# Patient Record
Sex: Female | Born: 1995 | Race: Black or African American | Hispanic: No | Marital: Single | Smoking: Never smoker
Health system: Southern US, Community
[De-identification: ages and names within clinical notes are randomized; demographics above are authoritative.]

## PROBLEM LIST (undated history)

## (undated) DIAGNOSIS — E282 Polycystic ovarian syndrome: Secondary | ICD-10-CM

## (undated) DIAGNOSIS — J189 Pneumonia, unspecified organism: Secondary | ICD-10-CM

## (undated) DIAGNOSIS — J45909 Unspecified asthma, uncomplicated: Secondary | ICD-10-CM

## (undated) HISTORY — PX: WISDOM TOOTH EXTRACTION: SHX21

---

## 2013-01-14 HISTORY — PX: DILATION AND CURETTAGE OF UTERUS: SHX78

## 2014-12-03 ENCOUNTER — Encounter (HOSPITAL_COMMUNITY): Payer: Self-pay | Admitting: *Deleted

## 2014-12-03 ENCOUNTER — Emergency Department (HOSPITAL_COMMUNITY)
Admission: EM | Admit: 2014-12-03 | Discharge: 2014-12-03 | Disposition: A | Discharge status: Home / Self Care | Payer: Self-pay | Attending: Emergency Medicine | Admitting: Emergency Medicine

## 2014-12-03 DIAGNOSIS — E860 Dehydration: Secondary | ICD-10-CM | POA: Insufficient documentation

## 2014-12-03 DIAGNOSIS — Z3202 Encounter for pregnancy test, result negative: Secondary | ICD-10-CM | POA: Insufficient documentation

## 2014-12-03 DIAGNOSIS — R1084 Generalized abdominal pain: Secondary | ICD-10-CM | POA: Insufficient documentation

## 2014-12-03 LAB — CBC WITH DIFFERENTIAL/PLATELET
BASOS ABS: 0 10*3/uL (ref 0.0–0.1)
BASOS PCT: 0 %
Eosinophils Absolute: 0.1 10*3/uL (ref 0.0–0.7)
Eosinophils Relative: 1 %
HEMATOCRIT: 42.3 % (ref 36.0–46.0)
HEMOGLOBIN: 14.5 g/dL (ref 12.0–15.0)
LYMPHS PCT: 25 %
Lymphs Abs: 1.8 10*3/uL (ref 0.7–4.0)
MCH: 31.1 pg (ref 26.0–34.0)
MCHC: 34.3 g/dL (ref 30.0–36.0)
MCV: 90.8 fL (ref 78.0–100.0)
MONO ABS: 0.5 10*3/uL (ref 0.1–1.0)
Monocytes Relative: 7 %
NEUTROS ABS: 4.6 10*3/uL (ref 1.7–7.7)
NEUTROS PCT: 67 %
Platelets: 288 10*3/uL (ref 150–400)
RBC: 4.66 MIL/uL (ref 3.87–5.11)
RDW: 11.6 % (ref 11.5–15.5)
WBC: 6.9 10*3/uL (ref 4.0–10.5)

## 2014-12-03 LAB — COMPREHENSIVE METABOLIC PANEL
ALT: 17 U/L (ref 14–54)
ANION GAP: 7 (ref 5–15)
AST: 26 U/L (ref 15–41)
Albumin: 3.8 g/dL (ref 3.5–5.0)
Alkaline Phosphatase: 58 U/L (ref 38–126)
BILIRUBIN TOTAL: 0.6 mg/dL (ref 0.3–1.2)
BUN: 9 mg/dL (ref 6–20)
CALCIUM: 9.4 mg/dL (ref 8.9–10.3)
CO2: 24 mmol/L (ref 22–32)
Chloride: 105 mmol/L (ref 101–111)
Creatinine, Ser: 1.09 mg/dL — ABNORMAL HIGH (ref 0.44–1.00)
GLUCOSE: 116 mg/dL — AB (ref 65–99)
POTASSIUM: 3.9 mmol/L (ref 3.5–5.1)
Sodium: 136 mmol/L (ref 135–145)
TOTAL PROTEIN: 7.5 g/dL (ref 6.5–8.1)

## 2014-12-03 LAB — I-STAT BETA HCG BLOOD, ED (MC, WL, AP ONLY)

## 2014-12-03 LAB — URINE MICROSCOPIC-ADD ON

## 2014-12-03 LAB — LIPASE, BLOOD: Lipase: 27 U/L (ref 11–51)

## 2014-12-03 LAB — URINALYSIS, ROUTINE W REFLEX MICROSCOPIC
BILIRUBIN URINE: NEGATIVE
Glucose, UA: NEGATIVE mg/dL
Hgb urine dipstick: NEGATIVE
Ketones, ur: NEGATIVE mg/dL
NITRITE: NEGATIVE
PH: 6 (ref 5.0–8.0)
Protein, ur: NEGATIVE mg/dL
SPECIFIC GRAVITY, URINE: 1.034 — AB (ref 1.005–1.030)

## 2014-12-03 MED ORDER — DEXAMETHASONE SODIUM PHOSPHATE 4 MG/ML IJ SOLN
8.0000 mg | Freq: Once | INTRAMUSCULAR | Status: AC
  Administered 2014-12-03: 8 mg via INTRAVENOUS
  Filled 2014-12-03: qty 2

## 2014-12-03 MED ORDER — DIPHENHYDRAMINE HCL 50 MG/ML IJ SOLN
25.0000 mg | Freq: Once | INTRAMUSCULAR | Status: AC
  Administered 2014-12-03: 25 mg via INTRAVENOUS
  Filled 2014-12-03: qty 1

## 2014-12-03 MED ORDER — SODIUM CHLORIDE 0.9 % IV BOLUS (SEPSIS)
1000.0000 mL | Freq: Once | INTRAVENOUS | Status: AC
  Administered 2014-12-03: 1000 mL via INTRAVENOUS

## 2014-12-03 MED ORDER — MORPHINE SULFATE (PF) 4 MG/ML IV SOLN
4.0000 mg | Freq: Once | INTRAVENOUS | Status: AC
  Administered 2014-12-03: 4 mg via INTRAVENOUS
  Filled 2014-12-03: qty 1

## 2014-12-03 MED ORDER — ONDANSETRON HCL 4 MG/2ML IJ SOLN
4.0000 mg | Freq: Once | INTRAMUSCULAR | Status: AC
  Administered 2014-12-03: 4 mg via INTRAVENOUS
  Filled 2014-12-03: qty 2

## 2014-12-03 MED ORDER — PROMETHAZINE HCL 25 MG PO TABS: 25.0000 mg | ORAL_TABLET | Freq: Four times a day (QID) | ORAL | Status: DC | PRN

## 2014-12-03 NOTE — ED Provider Notes (Signed)
CSN: [id number]     Arrival date & time 12/03/14  1358 History   First MD Initiated Contact with Patient 12/03/14 1415     Chief Complaint  Patient presents with  . Emesis  . Abdominal Pain   HPI   Michele Peters is a 19 y.o. F with no significant PMH presenting with generalized abdominal pain, N/V, and a headache since yesterday. She describes her abdominal pain as 7/10 pain scale, crampy, generalized, non-radiating, constant. She describes her headache as temporal, 7/10 pain scale, intermittent, dull, lasting a few minutes, non-radiating. She does not have a headache at this time. She endorses nausea and vomiting (one episode). She denies fevers, chills, AMS, visual changes, neck stiffness, CP, SOB, diarrhea, dysuria, vaginal odor/itching/discharge, alleviation attempts, recent travel/abx use, raw/undercooked foods, ill contacts.   History reviewed. No pertinent past medical history. History reviewed. No pertinent past surgical history. History reviewed. No pertinent family history. Social History  Substance Use Topics  . Smoking status: Never Smoker   . Smokeless tobacco: None  . Alcohol Use: No   OB History    No data available     Review of Systems  Ten systems are reviewed and are negative for acute change except as noted in the HPI  Allergies  Eggs or egg-derived products  Home Medications   Prior to Admission medications   Medication Sig Start Date End Date Taking? Authorizing Provider  diphenhydrAMINE (BENADRYL) 25 MG tablet Take 25 mg by mouth every 6 (six) hours as needed for itching.   Yes Historical Provider, MD  ibuprofen (ADVIL,MOTRIN) 200 MG tablet Take 200 mg by mouth every 6 (six) hours as needed for fever.   Yes Historical Provider, MD   BP 146/101 mmHg  Pulse 111  Temp(Src) 98.9 F (37.2 C) (Oral)  Resp 18  SpO2 99%  LMP 11/08/2014 Physical Exam  Constitutional: She appears well-developed and well-nourished. No distress.  HENT:  Head:  Normocephalic and atraumatic.  Right Ear: External ear normal.  Left Ear: External ear normal.  Nose: Nose normal.  Mouth/Throat: Oropharynx is clear and moist. No oropharyngeal exudate.  Eyes: Conjunctivae are normal. Pupils are equal, round, and reactive to light. Right eye exhibits no discharge. Left eye exhibits no discharge. No scleral icterus.  Neck: No tracheal deviation present.  Cardiovascular: Normal rate, regular rhythm, normal heart sounds and intact distal pulses.  Exam reveals no gallop and no friction rub.   No murmur heard. Pulmonary/Chest: Effort normal and breath sounds normal. No respiratory distress. She has no wheezes. She has no rales. She exhibits no tenderness.  Abdominal: Soft. Bowel sounds are normal. She exhibits no distension and no mass. There is tenderness. There is no rebound and no guarding.  Generalized abdominal tenderness.  Musculoskeletal: She exhibits no edema.  Lymphadenopathy:    She has no cervical adenopathy.  Neurological: She is alert. Coordination normal.  Skin: Skin is warm and dry. No rash noted. She is not diaphoretic. No erythema.  Psychiatric: She has a normal mood and affect. Her behavior is normal.  Nursing note and vitals reviewed.   ED Course  Procedures (including critical care time) Labs Review Labs Reviewed  COMPREHENSIVE METABOLIC PANEL - Abnormal; Notable for the following:    Glucose, Bld 116 (*)    Creatinine, Ser 1.09 (*)    All other components within normal limits  URINALYSIS, ROUTINE W REFLEX MICROSCOPIC (NOT AT [facility]) - Abnormal; Notable for the following:    Specific Gravity, Urine 1.034 (*)  Leukocytes, UA SMALL (*)    All other components within normal limits  URINE MICROSCOPIC-ADD ON - Abnormal; Notable for the following:    Squamous Epithelial / LPF 0-5 (*)    Bacteria, UA FEW (*)    All other components within normal limits  CBC WITH DIFFERENTIAL/PLATELET  LIPASE, BLOOD  I-STAT BETA HCG BLOOD, ED ([facility], [facility],  [facility] ONLY)     MDM   Final diagnoses:  Dehydration   Patient non-toxic appearing and VSS. Afebrile. Diffuse abdominal pain on exam, without focal region of tenderness. No CVA tenderness. Most likely is gastroenteritis, IBS/IBD. Appendicitis, mesenteric ischemia, peritonitis, AAA, SBO, large bowel obstruction/volvulus, SBP, DKA less likely based on patient history and physical exam.   3:01 PM Informed patient is having allergic rxn to morphine and zofran. Hives on right arm.   Labs demonstrate dehydration. Possible UTI but patient's symptoms/exam do not reflect this, so will not treat at this time.   Reevaluated patient after decadron shot. Hives have dissipated.  Patient feeling better after fluids. Can tolerate PO. Patient may be safely discharged home. Discussed reasons for return. Patient to follow-up with primary care provider within one week. Patient in understanding and agreement with the plan.    Melton KrebsSamantha Nicole Parissa Chiao, PA-C 12/16/14 2103  Nelva Nayobert Beaton, MD 12/19/14 2236

## 2014-12-03 NOTE — ED Notes (Signed)
Pt is in stable condition upon d/c and ambulates from ED. 

## 2014-12-03 NOTE — Discharge Instructions (Signed)
Ms. Michele Peters,  Nice meeting you! Please continue fluid intake and start bland foods as tolerated. Return to the emergency department if you develop fevers, chills, increasing abdominal pain, or are unable to keep fluids down. Feel better soon!  S. Lane HackerNicole Hernandez [place]asso, PA-C

## 2014-12-03 NOTE — ED Notes (Signed)
Pt reports abd cramping, n/v, headache since yesterday. Denies diarrhea.

## 2014-12-18 ENCOUNTER — Emergency Department (HOSPITAL_COMMUNITY)
Admission: EM | Admit: 2014-12-18 | Discharge: 2014-12-18 | Disposition: A | Discharge status: Home / Self Care | Payer: Self-pay | Attending: Emergency Medicine | Admitting: Emergency Medicine

## 2014-12-18 ENCOUNTER — Emergency Department (HOSPITAL_COMMUNITY): Payer: Self-pay

## 2014-12-18 ENCOUNTER — Encounter (HOSPITAL_COMMUNITY): Payer: Self-pay | Admitting: *Deleted

## 2014-12-18 DIAGNOSIS — Y999 Unspecified external cause status: Secondary | ICD-10-CM | POA: Insufficient documentation

## 2014-12-18 DIAGNOSIS — Z79899 Other long term (current) drug therapy: Secondary | ICD-10-CM | POA: Insufficient documentation

## 2014-12-18 DIAGNOSIS — R112 Nausea with vomiting, unspecified: Secondary | ICD-10-CM | POA: Insufficient documentation

## 2014-12-18 DIAGNOSIS — Y9289 Other specified places as the place of occurrence of the external cause: Secondary | ICD-10-CM | POA: Insufficient documentation

## 2014-12-18 DIAGNOSIS — Y9389 Activity, other specified: Secondary | ICD-10-CM | POA: Insufficient documentation

## 2014-12-18 DIAGNOSIS — S022XXA Fracture of nasal bones, initial encounter for closed fracture: Secondary | ICD-10-CM | POA: Insufficient documentation

## 2014-12-18 MED ORDER — NAPROXEN 500 MG PO TABS: 500.0000 mg | ORAL_TABLET | Freq: Two times a day (BID) | ORAL | Status: DC

## 2014-12-18 MED ORDER — HYDROCODONE-ACETAMINOPHEN 5-325 MG PO TABS
2.0000 | ORAL_TABLET | Freq: Once | ORAL | Status: AC
Start: 2014-12-18 — End: 2014-12-18
  Administered 2014-12-18: 2 via ORAL
  Filled 2014-12-18: qty 2

## 2014-12-18 NOTE — ED Notes (Signed)
Pt called out and stated "I would like to have some pain medicine" Antony MaduraKelly Humes PA notified and gave verbal order to give pt 2 norco.
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
## 2014-12-18 NOTE — ED Notes (Signed)
Pt verbalized understanding of d/c instructions and has no further questions. Pt stable and nad.  

## 2014-12-18 NOTE — Discharge Instructions (Signed)
Nasal Fracture A nasal fracture is a break or crack in the bones or cartilage of the nose. Minor breaks do not require treatment. These breaks usually heal on their own after about one month. Serious breaks may require surgery. CAUSES This injury is usually caused by a blunt injury to the nose. This type of injury often occurs from:  Contact sports.  Car accidents.  Falls.  Getting punched. SYMPTOMS Symptoms of this injury include:  Pain.  Swelling of the nose.  Bleeding from the nose.  Bruising around the nose or eyes. This may include having black eyes.  Crooked appearance of the nose. DIAGNOSIS This injury may be diagnosed with a physical exam. The health care provider will gently feel the nose for signs of broken bones. He or she will look inside the nostrils to make sure that there is not a blood-filled swelling on the dividing wall between the nostrils (septal hematoma). X-rays of the nose may not show a nasal fracture even when one is present. In some cases, X-rays or a CT scan may be done 1-5 days after the injury. Sometimes, the health care provider will want to wait until the swelling has gone down. TREATMENT Often, minor fractures that have caused no deformity do not require treatment. More serious fractures in which bones have moved out of position may require surgery, which will take place after the swelling is gone. Surgery will stabilize and align the fracture. In some cases, a health care provider may be able to reposition the bones without surgery. This may be done in the health care provider's office after medicine is given to numb the area (local anesthetic). HOME CARE INSTRUCTIONS  If directed, apply ice to the injured area:  Put ice in a plastic bag.  Place a towel between your skin and the bag.  Leave the ice on for 20 minutes, 2-3 times per day.  Take over-the-counter and prescription medicines only as told by your health care provider.  If your nose  starts to bleed, sit in an upright position while you squeeze the soft parts of your nose against the dividing wall between your nostrils (septum) for 10 minutes.  Try to avoid blowing your nose.  Return to your normal activities as told by your health care provider. Ask your health care provider what activities are safe for you.  Avoid contact sports for 3-4 weeks or as told by your health care provider.  Keep all follow-up visits as told by your health care provider. This is important. SEEK MEDICAL CARE IF:  Your pain increases or becomes severe.  You continue to have nosebleeds.  The shape of your nose does not return to normal within 5 days.  You have pus draining out of your nose. SEEK IMMEDIATE MEDICAL CARE IF:  You have bleeding from your nose that does not stop after you pinch your nostrils closed for 20 minutes and keep ice on your nose.  You have clear fluid draining out of your nose.  You notice a grape-like swelling on the septum. This swelling is a collection of blood (hematoma) that must be drained to help prevent infection.  You have difficulty moving your eyes.  You have repeated vomiting.   This information is not intended to replace advice given to you by your health care provider. Make sure you discuss any questions you have with your health care provider.   Document Released: 12/29/1999 Document Revised: 09/21/2014 Document Reviewed: 02/07/2014 Elsevier Interactive Patient Education 2016 [employer]  
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
## 2015-01-30 ENCOUNTER — Encounter (HOSPITAL_COMMUNITY): Payer: Self-pay

## 2015-01-30 DIAGNOSIS — Z3202 Encounter for pregnancy test, result negative: Secondary | ICD-10-CM | POA: Diagnosis not present

## 2015-01-30 DIAGNOSIS — N926 Irregular menstruation, unspecified: Secondary | ICD-10-CM | POA: Diagnosis not present

## 2015-01-30 DIAGNOSIS — R1030 Lower abdominal pain, unspecified: Secondary | ICD-10-CM | POA: Diagnosis present

## 2015-01-30 LAB — URINALYSIS, ROUTINE W REFLEX MICROSCOPIC
Bilirubin Urine: NEGATIVE
Glucose, UA: NEGATIVE mg/dL
KETONES UR: NEGATIVE mg/dL
NITRITE: NEGATIVE
PH: 5 (ref 5.0–8.0)
Protein, ur: NEGATIVE mg/dL
SPECIFIC GRAVITY, URINE: 1.024 (ref 1.005–1.030)

## 2015-01-30 LAB — URINE MICROSCOPIC-ADD ON

## 2015-01-30 LAB — CBC
HEMATOCRIT: 43.3 % (ref 36.0–46.0)
HEMOGLOBIN: 14.7 g/dL (ref 12.0–15.0)
MCH: 31.1 pg (ref 26.0–34.0)
MCHC: 33.9 g/dL (ref 30.0–36.0)
MCV: 91.5 fL (ref 78.0–100.0)
Platelets: 341 10*3/uL (ref 150–400)
RBC: 4.73 MIL/uL (ref 3.87–5.11)
RDW: 12 % (ref 11.5–15.5)
WBC: 7.2 10*3/uL (ref 4.0–10.5)

## 2015-01-30 LAB — COMPREHENSIVE METABOLIC PANEL
ALT: 19 U/L (ref 14–54)
ANION GAP: 7 (ref 5–15)
AST: 20 U/L (ref 15–41)
Albumin: 3.8 g/dL (ref 3.5–5.0)
Alkaline Phosphatase: 61 U/L (ref 38–126)
BILIRUBIN TOTAL: 0.3 mg/dL (ref 0.3–1.2)
BUN: 15 mg/dL (ref 6–20)
CO2: 26 mmol/L (ref 22–32)
Calcium: 9.6 mg/dL (ref 8.9–10.3)
Chloride: 106 mmol/L (ref 101–111)
Creatinine, Ser: 1.02 mg/dL — ABNORMAL HIGH (ref 0.44–1.00)
GFR calc Af Amer: >60 mL/min (ref >60)
Glucose, Bld: 84 mg/dL (ref 65–99)
POTASSIUM: 4 mmol/L (ref 3.5–5.1)
Sodium: 139 mmol/L (ref 135–145)
TOTAL PROTEIN: 7.3 g/dL (ref 6.5–8.1)

## 2015-01-30 LAB — LIPASE, BLOOD: Lipase: 31 U/L (ref 11–51)

## 2015-01-30 LAB — POC URINE PREG, ED: PREG TEST UR: NEGATIVE

## 2015-01-30 NOTE — ED Notes (Signed)
Pt has been having abd pain off and on for about 2 weeks now. Started bleeding this morning. Her LMP was November 23. Not sure if she is pregnant. States she is having cramps in her lower back also.

## 2015-01-31 ENCOUNTER — Emergency Department (HOSPITAL_COMMUNITY)
Admission: EM | Admit: 2015-01-31 | Discharge: 2015-01-31 | Disposition: A | Discharge status: Home / Self Care | Payer: Medicaid Other | Attending: Emergency Medicine | Admitting: Emergency Medicine

## 2015-01-31 DIAGNOSIS — N926 Irregular menstruation, unspecified: Secondary | ICD-10-CM

## 2015-01-31 LAB — GC/CHLAMYDIA PROBE AMP (~~LOC~~) NOT AT ARMC
Chlamydia: NEGATIVE
Neisseria Gonorrhea: NEGATIVE

## 2015-01-31 LAB — WET PREP, GENITAL
Sperm: NONE SEEN
Trich, Wet Prep: NONE SEEN
Yeast Wet Prep HPF POC: NONE SEEN

## 2015-01-31 MED ORDER — IBUPROFEN 800 MG PO TABS: 800.0000 mg | ORAL_TABLET | Freq: Three times a day (TID) | ORAL | Status: DC

## 2015-01-31 MED ORDER — IBUPROFEN 800 MG PO TABS
800.0000 mg | ORAL_TABLET | Freq: Once | ORAL | Status: AC
  Administered 2015-01-31: 800 mg via ORAL
  Filled 2015-01-31: qty 1

## 2015-01-31 NOTE — Discharge Instructions (Signed)
FOLLOW UP WITH [facility] FOR FURTHER EVALUATION TO DETERMINE THE CAUSE OF YOUR IRREGULAR PERIODS. TAKE IBUPROFEN FOR CRAMPING AND USE HEAT FOR COMFORT.

## 2015-01-31 NOTE — ED Notes (Signed)
Pt departed in NAD.  

## 2015-01-31 NOTE — ED Provider Notes (Signed)
CSN: [id number]     Arrival date & time 01/30/15  2138 History   First MD Initiated Contact with Patient 01/31/15 0231     Chief Complaint  Patient presents with  . Abdominal Pain  . Back Pain     (Consider location/radiation/quality/duration/timing/severity/associated sxs/prior Treatment) Patient is a 20 y.o. female presenting with abdominal pain and back pain. The history is provided by the patient. No language interpreter was used.  Abdominal Pain Pain location:  Suprapubic Pain quality: aching and cramping   Pain severity:  Mild Onset quality:  Gradual Duration:  2 weeks Timing:  Intermittent Progression:  Worsening Associated symptoms: no chills, no dysuria, no fever, no nausea and no vomiting   Associated symptoms comment:  Presents with complaint of lower abdominal and low back cramping type pain, intermittent for the past 2 weeks. She reports abnormal menses with last period in November 2016 and no history of irregularity. She denies vaginal discharge, dysuria, nausea, fever. She has taken home pregnancy tests that are negative.  Back Pain Associated symptoms: abdominal pain and pelvic pain   Associated symptoms: no dysuria and no fever     History reviewed. No pertinent past medical history. History reviewed. No pertinent past surgical history. No family history on file. Social History  Substance Use Topics  . Smoking status: Never Smoker   . Smokeless tobacco: None  . Alcohol Use: No   OB History    No data available     Review of Systems  Constitutional: Negative for fever and chills.  Gastrointestinal: Positive for abdominal pain. Negative for nausea and vomiting.  Genitourinary: Positive for menstrual problem and pelvic pain. Negative for dysuria.  Musculoskeletal: Positive for back pain. Negative for myalgias.  Neurological: Negative.       Allergies  Eggs or egg-derived products; Other; and Tetanus toxoids  Home Medications   Prior to Admission  medications   Medication Sig Start Date End Date Taking? Authorizing Provider  ibuprofen (ADVIL,MOTRIN) 800 MG tablet Take 1 tablet (800 mg total) by mouth 3 (three) times daily. 01/31/15   Elpidio Anis, PA-C  naproxen (NAPROSYN) 500 MG tablet Take 1 tablet (500 mg total) by mouth 2 (two) times daily. Patient not taking: Reported on 01/31/2015 12/18/14   Antony Madura, PA-C  promethazine (PHENERGAN) 25 MG tablet Take 1 tablet (25 mg total) by mouth every 6 (six) hours as needed for nausea or vomiting. Patient not taking: Reported on 01/31/2015 12/03/14   Melton Krebs, PA-C   BP 120/71 mmHg  Pulse 71  Temp(Src) 98.4 F (36.9 C) (Oral)  Resp 14  Ht  (1.499 m)  Wt 87.629 kg  BMI 39.00 kg/m2  SpO2 96%  LMP 12/07/2014 Physical Exam  Constitutional: She is oriented to person, place, and time. She appears well-developed and well-nourished.  HENT:  Head: Normocephalic.  Neck: Normal range of motion. Neck supple.  Cardiovascular: Normal rate and regular rhythm.   Pulmonary/Chest: Effort normal and breath sounds normal.  Abdominal: Soft. Bowel sounds are normal. There is no tenderness. There is no rebound and no guarding.  Genitourinary:  Cervix is unremarkable in appearance without discharge and no CMT. No adnexal mass or tenderness. Small amount of blood in vaginal vault.   Musculoskeletal: Normal range of motion.  Neurological: She is alert and oriented to person, place, and time.  Skin: Skin is warm and dry. No rash noted.  Psychiatric: She has a normal mood and affect.    ED Course  Procedures (including  critical care time) Labs Review Labs Reviewed  WET PREP, GENITAL - Abnormal; Notable for the following:    Clue Cells Wet Prep HPF POC PRESENT (*)    WBC, Wet Prep HPF POC MANY (*)    All other components within normal limits  COMPREHENSIVE METABOLIC PANEL - Abnormal; Notable for the following:    Creatinine, Ser 1.02 (*)    All other components within normal limits   URINALYSIS, ROUTINE W REFLEX MICROSCOPIC (NOT AT [facility]) - Abnormal; Notable for the following:    APPearance CLOUDY (*)    Hgb urine dipstick LARGE (*)    Leukocytes, UA SMALL (*)    All other components within normal limits  URINE MICROSCOPIC-ADD ON - Abnormal; Notable for the following:    Squamous Epithelial / LPF 6-30 (*)    Bacteria, UA MANY (*)    Crystals CA OXALATE CRYSTALS (*)    All other components within normal limits  LIPASE, BLOOD  CBC  POC URINE PREG, ED  GC/CHLAMYDIA PROBE AMP ([place]) NOT AT [facility]    Imaging Review No results found. I have personally reviewed and evaluated these images and lab results as part of my medical decision-making.   EKG Interpretation None      MDM   Final diagnoses:  Abnormal menses    No evidence of PID without discharge of bimanual tenderness. The patient is not pregnant. VSS. Will refer to GYN for further evaluation of irregular menses. Ibuprofen for cramping. The patient's UA is marginal without symptoms of UTI. Culture pending.     Elpidio Anis, PA-C 01/31/15 1610  Zadie Rhine, MD 01/31/15 [phone]

## 2015-03-15 ENCOUNTER — Encounter: Payer: Self-pay | Admitting: Obstetrics & Gynecology

## 2015-05-23 ENCOUNTER — Other Ambulatory Visit: Payer: Self-pay

## 2015-05-23 ENCOUNTER — Encounter (HOSPITAL_COMMUNITY): Payer: Self-pay | Admitting: *Deleted

## 2015-05-23 ENCOUNTER — Emergency Department (HOSPITAL_COMMUNITY): Payer: 59

## 2015-05-23 ENCOUNTER — Emergency Department (HOSPITAL_COMMUNITY)
Admission: EM | Admit: 2015-05-23 | Discharge: 2015-05-23 | Disposition: A | Discharge status: Home / Self Care | Payer: 59 | Attending: Emergency Medicine | Admitting: Emergency Medicine

## 2015-05-23 DIAGNOSIS — R0602 Shortness of breath: Secondary | ICD-10-CM | POA: Diagnosis present

## 2015-05-23 DIAGNOSIS — Z8701 Personal history of pneumonia (recurrent): Secondary | ICD-10-CM | POA: Diagnosis not present

## 2015-05-23 DIAGNOSIS — J209 Acute bronchitis, unspecified: Secondary | ICD-10-CM | POA: Diagnosis not present

## 2015-05-23 DIAGNOSIS — Z791 Long term (current) use of non-steroidal anti-inflammatories (NSAID): Secondary | ICD-10-CM | POA: Insufficient documentation

## 2015-05-23 DIAGNOSIS — R000 Tachycardia, unspecified: Secondary | ICD-10-CM | POA: Diagnosis not present

## 2015-05-23 DIAGNOSIS — J45901 Unspecified asthma with (acute) exacerbation: Secondary | ICD-10-CM | POA: Diagnosis not present

## 2015-05-23 HISTORY — DX: Unspecified asthma, uncomplicated: J45.909

## 2015-05-23 HISTORY — DX: Pneumonia, unspecified organism: J18.9

## 2015-05-23 LAB — BASIC METABOLIC PANEL
Anion gap: 11 (ref 5–15)
BUN: 15 mg/dL (ref 6–20)
CALCIUM: 9.6 mg/dL (ref 8.9–10.3)
CO2: 20 mmol/L — AB (ref 22–32)
CREATININE: 0.96 mg/dL (ref 0.44–1.00)
Chloride: 106 mmol/L (ref 101–111)
GFR calc Af Amer: >60 mL/min (ref >60)
GLUCOSE: 98 mg/dL (ref 65–99)
Potassium: 4.1 mmol/L (ref 3.5–5.1)
Sodium: 137 mmol/L (ref 135–145)

## 2015-05-23 LAB — CBC WITH DIFFERENTIAL/PLATELET
BASOS PCT: 0 %
Basophils Absolute: 0 10*3/uL (ref 0.0–0.1)
EOS ABS: 0.1 10*3/uL (ref 0.0–0.7)
EOS PCT: 1 %
HEMATOCRIT: 45.2 % (ref 36.0–46.0)
Hemoglobin: 15.2 g/dL — ABNORMAL HIGH (ref 12.0–15.0)
Lymphocytes Relative: 10 %
Lymphs Abs: 1 10*3/uL (ref 0.7–4.0)
MCH: 31 pg (ref 26.0–34.0)
MCHC: 33.6 g/dL (ref 30.0–36.0)
MCV: 92.2 fL (ref 78.0–100.0)
MONO ABS: 0.6 10*3/uL (ref 0.1–1.0)
MONOS PCT: 6 %
NEUTROS ABS: 8.3 10*3/uL — AB (ref 1.7–7.7)
Neutrophils Relative %: 83 %
PLATELETS: 310 10*3/uL (ref 150–400)
RBC: 4.9 MIL/uL (ref 3.87–5.11)
RDW: 12 % (ref 11.5–15.5)
WBC: 10 10*3/uL (ref 4.0–10.5)

## 2015-05-23 MED ORDER — ALBUTEROL SULFATE HFA 108 (90 BASE) MCG/ACT IN AERS
2.0000 | INHALATION_SPRAY | Freq: Once | RESPIRATORY_TRACT | Status: AC
  Administered 2015-05-23: 2 via RESPIRATORY_TRACT
  Filled 2015-05-23: qty 6.7

## 2015-05-23 MED ORDER — ALBUTEROL SULFATE (2.5 MG/3ML) 0.083% IN NEBU
INHALATION_SOLUTION | RESPIRATORY_TRACT | Status: DC
Start: 2015-05-23 — End: 2015-05-24
  Filled 2015-05-23: qty 6

## 2015-05-23 MED ORDER — ALBUTEROL SULFATE (2.5 MG/3ML) 0.083% IN NEBU
5.0000 mg | INHALATION_SOLUTION | Freq: Once | RESPIRATORY_TRACT | Status: AC
  Administered 2015-05-23: 5 mg via RESPIRATORY_TRACT
  Filled 2015-05-23: qty 6

## 2015-05-23 MED ORDER — ALBUTEROL SULFATE (2.5 MG/3ML) 0.083% IN NEBU
5.0000 mg | INHALATION_SOLUTION | Freq: Once | RESPIRATORY_TRACT | Status: AC
Start: 2015-05-23 — End: 2015-05-23
  Administered 2015-05-23: 5 mg via RESPIRATORY_TRACT

## 2015-05-23 MED ORDER — HYDROCODONE-ACETAMINOPHEN 5-325 MG PO TABS
1.0000 | ORAL_TABLET | Freq: Once | ORAL | Status: AC
  Administered 2015-05-23: 1 via ORAL
  Filled 2015-05-23: qty 1

## 2015-05-23 MED ORDER — AZITHROMYCIN 250 MG PO TABS: 250.0000 mg | ORAL_TABLET | Freq: Every day | ORAL | Status: DC

## 2015-05-23 MED ORDER — PREDNISONE 20 MG PO TABS
60.0000 mg | ORAL_TABLET | Freq: Once | ORAL | Status: DC
  Filled 2015-05-23: qty 3

## 2015-05-23 MED ORDER — IBUPROFEN 800 MG PO TABS
800.0000 mg | ORAL_TABLET | Freq: Once | ORAL | Status: AC
  Administered 2015-05-23: 800 mg via ORAL
  Filled 2015-05-23: qty 1

## 2015-05-23 MED ORDER — AZITHROMYCIN 250 MG PO TABS
500.0000 mg | ORAL_TABLET | Freq: Once | ORAL | Status: AC
  Administered 2015-05-23: 500 mg via ORAL
  Filled 2015-05-23: qty 2

## 2015-05-23 MED ORDER — HYDROCODONE-ACETAMINOPHEN 5-325 MG PO TABS: 1.0000 | ORAL_TABLET | ORAL | Status: DC | PRN

## 2015-05-23 NOTE — ED Provider Notes (Signed)
CSN: [id number]     Arrival date & time 05/23/15  1755 History   First MD Initiated Contact with Patient 05/23/15 2035     Chief Complaint  Patient presents with  . Cough  . Shortness of Breath  PT HERE WITH SOB AND COUGH.  SHE SAID THAT SHE IS COUGHING UP GREENISH SPUTUM THAT IS FLECKED WITH BLOOD.  THE PT SAID THAT IT FEELS LIKE SHE IS BREATHING THROUGH A STRAW.  PT HAS A HX OF ASTHMA, BUT IS OUT OF HER INHALER.   (Consider location/radiation/quality/duration/timing/severity/associated sxs/prior Treatment) Patient is a 20 y.o. female presenting with cough and shortness of breath. The history is provided by the patient.  Cough Cough characteristics:  Productive Sputum characteristics:  Bloody and green Severity:  Mild Onset quality:  Sudden Timing:  Constant Progression:  Unchanged Associated symptoms: shortness of breath and wheezing   Shortness of Breath Associated symptoms: cough and wheezing     Past Medical History  Diagnosis Date  . Asthma   . Pneumonia    History reviewed. No pertinent past surgical history. No family history on file. Social History  Substance Use Topics  . Smoking status: Never Smoker   . Smokeless tobacco: None  . Alcohol Use: No   OB History    No data available     Review of Systems  Respiratory: Positive for cough, shortness of breath and wheezing.   All other systems reviewed and are negative.     Allergies  Eggs or egg-derived products; Other; and Tetanus toxoids  Home Medications   Prior to Admission medications   Medication Sig Start Date End Date Taking? Authorizing Provider  ibuprofen (ADVIL,MOTRIN) 800 MG tablet Take 1 tablet (800 mg total) by mouth 3 (three) times daily. 01/31/15  Yes Shari Upstill, PA-C  azithromycin (ZITHROMAX Z-PAK) 250 MG tablet Take 1 tablet (250 mg total) by mouth daily. 05/23/15   Jacalyn Lefevre, MD  HYDROcodone-acetaminophen (NORCO/VICODIN) 5-325 MG tablet Take 1 tablet by mouth every 4 (four) hours as  needed. 05/23/15   Jacalyn Lefevre, MD   BP 98/83 mmHg  Pulse 104  Temp(Src) 98.9 F (37.2 C) (Oral)  Resp 18  SpO2 98%  LMP 05/14/2015 Physical Exam  Constitutional: She is oriented to person, place, and time. She appears well-developed and well-nourished.  HENT:  Head: Normocephalic and atraumatic.  Right Ear: External ear normal.  Left Ear: External ear normal.  Mouth/Throat: Oropharynx is clear and moist.  Eyes: Conjunctivae are normal. Pupils are equal, round, and reactive to light.  Neck: Normal range of motion. Neck supple.  Cardiovascular: Regular rhythm.  Tachycardia present.   Pulmonary/Chest: She has wheezes.  Abdominal: Soft. Bowel sounds are normal.  Musculoskeletal: Normal range of motion.  Neurological: She is alert and oriented to person, place, and time.  Skin: Skin is warm and dry.  Psychiatric: She has a normal mood and affect. Her behavior is normal.  Nursing note and vitals reviewed.   ED Course  Procedures (including critical care time) Labs Review Labs Reviewed  BASIC METABOLIC PANEL - Abnormal; Notable for the following:    CO2 20 (*)    All other components within normal limits  CBC WITH DIFFERENTIAL/PLATELET - Abnormal; Notable for the following:    Hemoglobin 15.2 (*)    Neutro Abs 8.3 (*)    All other components within normal limits    Imaging Review Dg Chest 2 View  05/23/2015  CLINICAL DATA:  20 year old female with cough and shortness of breath  EXAM: CHEST  2 VIEW COMPARISON:  None. FINDINGS: The heart size and mediastinal contours are within normal limits. Both lungs are clear. The visualized skeletal structures are unremarkable. IMPRESSION: No active cardiopulmonary disease. Electronically Signed   By: Elgie CollardArash  Radparvar M.D.   On: 05/23/2015 19:02   I have personally reviewed and evaluated these images and lab results as part of my medical decision-making.   EKG Interpretation None      MDM  PT REFUSED THE PREDNISONE.  SHE SAID THAT IT  MAKES HER FEEL BAD.  THE PT IS FEELING BETTER AND HAS AN ALBUTEROL INHALER IN HAND TO GO HOME.  PT KNOWS TO RETURN IF WORSE. Final diagnoses:  Acute bronchitis, unspecified organism        Jacalyn LefevreJulie Siniya Lichty, MD 05/23/15 2159

## 2015-05-23 NOTE — ED Notes (Signed)
Pt very defensive during treatment refuses to get Prednisone, states she doesn't like that medication, pt requesting Abx., pt oriented that is not order for abx that her white count are WNL and the Md may not see a need for abx, pt then got more defensive and requested to this RN to ask for Abx. Pt told that I will notified the EDP.

## 2015-05-23 NOTE — Discharge Instructions (Signed)

## 2015-05-23 NOTE — ED Notes (Signed)
PT is here with cough and states coughing up blood and green sputum.  Pt states it feels like she is breathing through a straw.  She is talking in full sentences. No diaphoresis or labored breathing

## 2015-06-08 ENCOUNTER — Other Ambulatory Visit: Payer: Self-pay | Admitting: Nurse Practitioner

## 2015-06-08 ENCOUNTER — Other Ambulatory Visit (HOSPITAL_COMMUNITY)
Admission: RE | Admit: 2015-06-08 | Discharge: 2015-06-08 | Disposition: A | Discharge status: Home / Self Care | Payer: 59 | Source: Ambulatory Visit | Attending: Nurse Practitioner | Admitting: Nurse Practitioner

## 2015-06-08 DIAGNOSIS — Z113 Encounter for screening for infections with a predominantly sexual mode of transmission: Secondary | ICD-10-CM | POA: Diagnosis present

## 2015-06-08 DIAGNOSIS — Z01419 Encounter for gynecological examination (general) (routine) without abnormal findings: Secondary | ICD-10-CM | POA: Insufficient documentation

## 2015-06-13 LAB — CYTOLOGY - PAP

## 2015-10-27 ENCOUNTER — Inpatient Hospital Stay (HOSPITAL_COMMUNITY)
Admission: AD | Admit: 2015-10-27 | Discharge: 2015-10-27 | Disposition: A | Discharge status: Home / Self Care | Payer: Medicaid Other | Source: Ambulatory Visit | Attending: Obstetrics and Gynecology | Admitting: Obstetrics and Gynecology

## 2015-10-27 ENCOUNTER — Encounter (HOSPITAL_COMMUNITY): Payer: Self-pay | Admitting: Student

## 2015-10-27 ENCOUNTER — Inpatient Hospital Stay (HOSPITAL_COMMUNITY): Payer: Medicaid Other

## 2015-10-27 DIAGNOSIS — Z887 Allergy status to serum and vaccine status: Secondary | ICD-10-CM | POA: Diagnosis not present

## 2015-10-27 DIAGNOSIS — Z3202 Encounter for pregnancy test, result negative: Secondary | ICD-10-CM | POA: Diagnosis not present

## 2015-10-27 DIAGNOSIS — Z91012 Allergy to eggs: Secondary | ICD-10-CM | POA: Insufficient documentation

## 2015-10-27 DIAGNOSIS — R102 Pelvic and perineal pain: Secondary | ICD-10-CM | POA: Insufficient documentation

## 2015-10-27 DIAGNOSIS — N946 Dysmenorrhea, unspecified: Secondary | ICD-10-CM | POA: Insufficient documentation

## 2015-10-27 DIAGNOSIS — Z9109 Other allergy status, other than to drugs and biological substances: Secondary | ICD-10-CM | POA: Insufficient documentation

## 2015-10-27 DIAGNOSIS — R109 Unspecified abdominal pain: Secondary | ICD-10-CM

## 2015-10-27 DIAGNOSIS — E282 Polycystic ovarian syndrome: Secondary | ICD-10-CM | POA: Diagnosis not present

## 2015-10-27 DIAGNOSIS — R110 Nausea: Secondary | ICD-10-CM | POA: Diagnosis not present

## 2015-10-27 HISTORY — DX: Polycystic ovarian syndrome: E28.2

## 2015-10-27 LAB — URINALYSIS, ROUTINE W REFLEX MICROSCOPIC
Bilirubin Urine: NEGATIVE
Glucose, UA: NEGATIVE mg/dL
Ketones, ur: NEGATIVE mg/dL
Leukocytes, UA: NEGATIVE
Nitrite: NEGATIVE
PROTEIN: NEGATIVE mg/dL
Specific Gravity, Urine: 1.02 (ref 1.005–1.030)
pH: 5.5 (ref 5.0–8.0)

## 2015-10-27 LAB — CBC
HEMATOCRIT: 39.7 % (ref 36.0–46.0)
Hemoglobin: 14 g/dL (ref 12.0–15.0)
MCH: 31.3 pg (ref 26.0–34.0)
MCHC: 35.3 g/dL (ref 30.0–36.0)
MCV: 88.6 fL (ref 78.0–100.0)
PLATELETS: 307 10*3/uL (ref 150–400)
RBC: 4.48 MIL/uL (ref 3.87–5.11)
RDW: 12.3 % (ref 11.5–15.5)
WBC: 7.1 10*3/uL (ref 4.0–10.5)

## 2015-10-27 LAB — URINE MICROSCOPIC-ADD ON

## 2015-10-27 LAB — POCT PREGNANCY, URINE: Preg Test, Ur: NEGATIVE

## 2015-10-27 MED ORDER — PROMETHAZINE HCL 25 MG PO TABS
12.5000 mg | ORAL_TABLET | Freq: Once | ORAL | Status: AC
  Administered 2015-10-27: 12.5 mg via ORAL
  Filled 2015-10-27: qty 1

## 2015-10-27 MED ORDER — IBUPROFEN 600 MG PO TABS
600.0000 mg | ORAL_TABLET | Freq: Once | ORAL | Status: AC
  Administered 2015-10-27: 600 mg via ORAL
  Filled 2015-10-27: qty 1

## 2015-10-27 MED ORDER — PROMETHAZINE HCL 12.5 MG PO TABS: 12.5000 mg | ORAL_TABLET | Freq: Four times a day (QID) | ORAL | 0 refills | Status: AC | PRN

## 2015-10-27 MED ORDER — IBUPROFEN 800 MG PO TABS: 800.0000 mg | ORAL_TABLET | Freq: Three times a day (TID) | ORAL | 3 refills | Status: AC | PRN

## 2015-10-27 NOTE — MAU Note (Signed)
Hx of PCOS.  Woke up this morning was bleeding.  First period since June.  Has soaked 3 pads since 1000 this morning.  Sharp pains started last night.  Stabbing pain in RLQ, back is hurting and is nauseous.

## 2015-10-27 NOTE — Discharge Instructions (Signed)
Dysmenorrhea °Menstrual cramps (dysmenorrhea) are caused by the muscles of the uterus tightening (contracting) during a menstrual period. For some women, this discomfort is merely bothersome. For others, dysmenorrhea can be severe enough to interfere with everyday activities for a few days each month. °Primary dysmenorrhea is menstrual cramps that last a couple of days when you start having menstrual periods or soon after. This often begins after a teenager starts having her period. As a woman gets older or has a baby, the cramps will usually lessen or disappear. Secondary dysmenorrhea begins later in life, lasts longer, and the pain may be stronger than primary dysmenorrhea. The pain may start before the period and last a few days after the period.  °CAUSES  °Dysmenorrhea is usually caused by an underlying problem, such as: °· The tissue lining the uterus grows outside of the uterus in other areas of the body (endometriosis). °· The endometrial tissue, which normally lines the uterus, is found in or grows into the muscular walls of the uterus (adenomyosis). °· The pelvic blood vessels are engorged with blood just before the menstrual period (pelvic congestive syndrome). °· Overgrowth of cells (polyps) in the lining of the uterus or cervix. °· Falling down of the uterus (prolapse) because of loose or stretched ligaments. °· Depression. °· Bladder problems, infection, or inflammation. °· Problems with the intestine, a tumor, or irritable bowel syndrome. °· Cancer of the female organs or bladder. °· A severely tipped uterus. °· A very tight opening or closed cervix. °· Noncancerous tumors of the uterus (fibroids). °· Pelvic inflammatory disease (PID). °· Pelvic scarring (adhesions) from a previous surgery. °· Ovarian cyst. °· An intrauterine device (IUD) used for birth control. °RISK FACTORS °You may be at greater risk of dysmenorrhea if: °· You are younger than age 30. °· You started puberty early. °· You have  irregular or heavy bleeding. °· You have never given birth. °· You have a family history of this problem. °· You are a smoker. °SIGNS AND SYMPTOMS  °· Cramping or throbbing pain in your lower abdomen. °· Headaches. °· Lower back pain. °· Nausea or vomiting. °· Diarrhea. °· Sweating or dizziness. °· Loose stools. °DIAGNOSIS  °A diagnosis is based on your history, symptoms, physical exam, diagnostic tests, or procedures. Diagnostic tests or procedures may include: °· Blood tests. °· Ultrasonography. °· An examination of the lining of the uterus (dilation and curettage, D&C). °· An examination inside your abdomen or pelvis with a scope (laparoscopy). °· X-rays. °· CT scan. °· MRI. °· An examination inside the bladder with a scope (cystoscopy). °· An examination inside the intestine or stomach with a scope (colonoscopy, gastroscopy). °TREATMENT  °Treatment depends on the cause of the dysmenorrhea. Treatment may include: °· Pain medicine prescribed by your health care provider. °· Birth control pills or an IUD with progesterone hormone in it. °· Hormone replacement therapy. °· Nonsteroidal anti-inflammatory drugs (NSAIDs). These may help stop the production of prostaglandins. °· Surgery to remove adhesions, endometriosis, ovarian cyst, or fibroids. °· Removal of the uterus (hysterectomy). °· Progesterone shots to stop the menstrual period. °· Cutting the nerves on the sacrum that go to the female organs (presacral neurectomy). °· Electric current to the sacral nerves (sacral nerve stimulation). °· Antidepressant medicine. °· Psychiatric therapy, counseling, or group therapy. °· Exercise and physical therapy. °· Meditation and yoga therapy. °· Acupuncture. °HOME CARE INSTRUCTIONS  °· Only take over-the-counter or prescription medicines as directed by your health care provider. °· Place a heating pad   or hot water bottle on your lower back or abdomen. Do not sleep with the heating pad.  Use aerobic exercises, walking,  swimming, biking, and other exercises to help lessen the cramping.  Massage to the lower back or abdomen may help.  Stop smoking.  Avoid alcohol and caffeine. SEEK MEDICAL CARE IF:   Your pain does not get better with medicine.  You have pain with sexual intercourse.  Your pain increases and is not controlled with medicines.  You have abnormal vaginal bleeding with your period.  You develop nausea or vomiting with your period that is not controlled with medicine. SEEK IMMEDIATE MEDICAL CARE IF:  You pass out.    This information is not intended to replace advice given to you by your health care provider. Make sure you discuss any questions you have with your health care provider.   Document Released: 12/31/2004 Document Revised: 09/02/2012 Document Reviewed: 06/18/2012 Elsevier Interactive Patient Education 2016 Elsevier Inc. Polycystic Ovarian Syndrome Polycystic ovarian syndrome (PCOS) is a common hormonal disorder among women of reproductive age. Most women with PCOS grow many small cysts on their ovaries. PCOS can cause problems with your periods and make it difficult to get pregnant. It can also cause an increased risk of miscarriage with pregnancy. If left untreated, PCOS can lead to serious health problems, such as diabetes and heart disease. CAUSES The cause of PCOS is not fully understood, but genetics may be a factor. SIGNS AND SYMPTOMS   Infrequent or no menstrual periods.   Inability to get pregnant (infertility) because of not ovulating.   Increased growth of hair on the face, chest, stomach, back, thumbs, thighs, or toes.   Acne, oily skin, or dandruff.   Pelvic pain.   Weight gain or obesity, usually carrying extra weight around the waist.   Type 2 diabetes.   High cholesterol.   High blood pressure.   Female-pattern baldness or thinning hair.   Patches of thickened and dark brown or black skin on the neck, arms, breasts, or thighs.    Tiny excess flaps of skin (skin tags) in the armpits or neck area.   Excessive snoring and having breathing stop at times while asleep (sleep apnea).   Deepening of the voice.   Gestational diabetes when pregnant.  DIAGNOSIS  There is no single test to diagnose PCOS.   Your health care provider will:   Take a medical history.   Perform a pelvic exam.   Have ultrasonography done.   Check your female and female hormone levels.   Measure glucose or sugar levels in the blood.   Do other blood tests.   If you are producing too many female hormones, your health care provider will make sure it is from PCOS. At the physical exam, your health care provider will want to evaluate the areas of increased hair growth. Try to allow natural hair growth for a few days before the visit.   During a pelvic exam, the ovaries may be enlarged or swollen because of the increased number of small cysts. This can be seen more easily by using vaginal ultrasonography or screening to examine the ovaries and lining of the uterus (endometrium) for cysts. The uterine lining may become thicker if you have not been having a regular period.  TREATMENT  Because there is no cure for PCOS, it needs to be managed to prevent problems. Treatments are based on your symptoms. Treatment is also based on whether you want to have a baby or  whether you need contraception.  Treatment may include:   Progesterone hormone to start a menstrual period.   Birth control pills to make you have regular menstrual periods.   Medicines to make you ovulate, if you want to get pregnant.   Medicines to control your insulin.   Medicine to control your blood pressure.   Medicine and diet to control your high cholesterol and triglycerides in your blood.  Medicine to reduce excessive hair growth.  Surgery, making small holes in the ovary, to decrease the amount of female hormone production. This is done through a long,  lighted tube (laparoscope) placed into the pelvis through a tiny incision in the lower abdomen.  HOME CARE INSTRUCTIONS  Only take over-the-counter or prescription medicine as directed by your health care provider.  Pay attention to the foods you eat and your activity levels. This can help reduce the effects of PCOS.  Keep your weight under control.  Eat foods that are low in carbohydrate and high in fiber.  Exercise regularly. SEEK MEDICAL CARE IF:  Your symptoms do not get better with medicine.  You have new symptoms.   This information is not intended to replace advice given to you by your health care provider. Make sure you discuss any questions you have with your health care provider.   Document Released: 04/26/2004 Document Revised: 10/21/2012 Document Reviewed: 06/18/2012 Elsevier Interactive Patient Education [employer] Elsevier Inc[employer]

## 2015-10-27 NOTE — MAU Provider Note (Signed)
History     CSN: [id number]  Arrival date and time: 10/27/15 1408   First Provider Initiated Contact with Patient 10/27/15 1452      Chief Complaint  Patient presents with  . Vaginal Bleeding  . Abdominal Pain  . Nausea   HPI  Michele Peters is a 20 y.o. female who presents with abdominal pain and vaginal bleeding. Pt diagnosed with PCOS earlier this year & reports irregular menses for less than a year. Last period was either May or June. Woke up this morning with heavy vaginal bleeding & lower abdominal/pelvic pain. States she has saturated 3 pads since 10am this morning.  Lower abdominal pain rates 10/10. Describes as constant sharp pain that radiates down the front of her thighs. Took a goodys powder this morning without relief. Denies alleviating or aggravating factors. Endorses nausea. Denies vomiting, diarrhea, constipation, fever/chills, or dysuria. No contraception. Was told she should take OCPs to treat the PCOS but was not prescribed and she never called to check up on it.  Called office this morning and was told she should come to MAU since there was no one in the office today to do an ultrasound.   Pertinent Gynecological History: Menses: flow is light, irregular occurring approximately every 30 days to 3 months without intermenstrual spotting, usually lasting 3 days and with minimal cramping Bleeding: dysfunctional uterine bleeding Contraception: none Blood transfusions: none Sexually transmitted diseases: no past history    Past Medical History:  Diagnosis Date  . Asthma   . PCOS (polycystic ovarian syndrome)   . Pneumonia     Past Surgical History:  Procedure Laterality Date  . DILATION AND CURETTAGE OF UTERUS  2015   SAB  . WISDOM TOOTH EXTRACTION      History reviewed. No pertinent family history.  Social History  Substance Use Topics  . Smoking status: Never Smoker  . Smokeless tobacco: Never Used  . Alcohol use No    Allergies:  Allergies   Allergen Reactions  . Eggs Or Egg-Derived Products Hives and Itching  . Other Hives  . Tetanus Toxoids Hives    Prescriptions Prior to Admission  Medication Sig Dispense Refill Last Dose  . Prenatal Vit-Fe Fumarate-FA (PRENATAL MULTIVITAMIN) TABS tablet Take 1 tablet by mouth daily at 12 noon.   10/26/2015 at Unknown time  . azithromycin (ZITHROMAX Z-PAK) 250 MG tablet Take 1 tablet (250 mg total) by mouth daily. (Patient not taking: Reported on 10/27/2015) 4 tablet 0 Not Taking at Unknown time  . HYDROcodone-acetaminophen (NORCO/VICODIN) 5-325 MG tablet Take 1 tablet by mouth every 4 (four) hours as needed. (Patient not taking: Reported on 10/27/2015) 10 tablet 0 Not Taking at Unknown time  . ibuprofen (ADVIL,MOTRIN) 800 MG tablet Take 1 tablet (800 mg total) by mouth 3 (three) times daily. (Patient not taking: Reported on 10/27/2015) 21 tablet 0 Not Taking at Unknown time    Review of Systems  Constitutional: Negative for chills and fever.  Gastrointestinal: Positive for abdominal pain and nausea. Negative for constipation, diarrhea and vomiting.  Genitourinary: Negative for dysuria.       + vaginal bleeding   Physical Exam   Blood pressure 122/71, pulse 76, temperature 98.2 F (36.8 C), temperature source Oral, resp. rate 18, weight 199 lb 8 oz (90.5 kg), last menstrual period 10/27/2015, SpO2 98 %.  Physical Exam  Nursing note and vitals reviewed. Constitutional: She is oriented to person, place, and time. She appears well-developed and well-nourished. No distress.  HENT:  Head:  Normocephalic and atraumatic.  Eyes: Conjunctivae are normal. Right eye exhibits no discharge. Left eye exhibits no discharge. No scleral icterus.  Neck: Normal range of motion.  Cardiovascular: Normal rate, regular rhythm and normal heart sounds.   No murmur heard. Respiratory: Effort normal and breath sounds normal. No respiratory distress. She has no wheezes.  GI: Soft. Bowel sounds are normal. She  exhibits no distension. There is tenderness in the right lower quadrant, suprapubic area and left lower quadrant. There is no rigidity and no guarding.  Genitourinary: Uterus is enlarged. Cervix exhibits no motion tenderness and no friability. Right adnexum displays no mass and no tenderness. Left adnexum displays no mass and no tenderness. There is bleeding (small amount of dark red blood in vault cleared out with 1 fox swab, no active bleeding) in the vagina.  Neurological: She is alert and oriented to person, place, and time.  Skin: Skin is warm and dry. She is not diaphoretic.  Psychiatric: She has a normal mood and affect. Her behavior is normal. Judgment and thought content normal.    MAU Course  Procedures Results for orders placed or performed during the hospital encounter of 10/27/15 (from the past 24 hour(s))  Urinalysis, Routine w reflex microscopic (not at [facility])     Status: Abnormal   Collection Time: 10/27/15  2:20 PM  Result Value Ref Range   Color, Urine YELLOW YELLOW   APPearance CLEAR CLEAR   Specific Gravity, Urine 1.020 1.005 - 1.030   pH 5.5 5.0 - 8.0   Glucose, UA NEGATIVE NEGATIVE mg/dL   Hgb urine dipstick LARGE (A) NEGATIVE   Bilirubin Urine NEGATIVE NEGATIVE   Ketones, ur NEGATIVE NEGATIVE mg/dL   Protein, ur NEGATIVE NEGATIVE mg/dL   Nitrite NEGATIVE NEGATIVE   Leukocytes, UA NEGATIVE NEGATIVE  Urine microscopic-add on     Status: Abnormal   Collection Time: 10/27/15  2:20 PM  Result Value Ref Range   Squamous Epithelial / LPF 0-5 (A) NONE SEEN   WBC, UA 0-5 0 - 5 WBC/hpf   RBC / HPF 6-30 0 - 5 RBC/hpf   Bacteria, UA RARE (A) NONE SEEN  Pregnancy, urine POC     Status: None   Collection Time: 10/27/15  2:40 PM  Result Value Ref Range   Preg Test, Ur NEGATIVE NEGATIVE  CBC     Status: None   Collection Time: 10/27/15  3:00 PM  Result Value Ref Range   WBC 7.1 4.0 - 10.5 K/uL   RBC 4.48 3.87 - 5.11 MIL/uL   Hemoglobin 14.0 12.0 - 15.0 g/dL   HCT 16.1  09.6 - 04.5 %   MCV 88.6 78.0 - 100.0 fL   MCH 31.3 26.0 - 34.0 pg   MCHC 35.3 30.0 - 36.0 g/dL   RDW 40.9 81.1 - 91.4 %   Platelets 307 150 - 400 K/uL   US Transvaginal Non-ob  Result Date: 10/27/2015 CLINICAL DATA:  Right-sided pain. EXAM: TRANSABDOMINAL AND TRANSVAGINAL ULTRASOUND OF PELVIS TECHNIQUE: Both transabdominal and transvaginal ultrasound examinations of the pelvis were performed. Transabdominal technique was performed for global imaging of the pelvis including uterus, ovaries, adnexal regions, and pelvic cul-de-sac. It was necessary to proceed with endovaginal exam following the transabdominal exam to visualize the endometrium and ovaries. COMPARISON:  None FINDINGS: Uterus Measurements: 8 x 3.2 x 4.2 cm. No fibroids or other mass visualized. Endometrium Thickness: 6 mm.  No focal abnormality visualized. Right ovary Measurements: 3.8 x 1.2 x 1.8 cm. Normal appearance/no adnexal mass.  Left ovary Measurements: 2.7 x 2.0 x 2.4 cm. Normal appearance/no adnexal mass. Other findings Trace fluid in the cul-de-sac, likely physiologic. IMPRESSION: No acute abnormalities to explain the patient's symptoms. Electronically Signed   By: Gerome Samavid  Williams III M.D   On: 10/27/2015 17:00   [place] Pelvis Complete  Result Date: 10/27/2015 CLINICAL DATA:  Right-sided pain. EXAM: TRANSABDOMINAL AND TRANSVAGINAL ULTRASOUND OF PELVIS TECHNIQUE: Both transabdominal and transvaginal ultrasound examinations of the pelvis were performed. Transabdominal technique was performed for global imaging of the pelvis including uterus, ovaries, adnexal regions, and pelvic cul-de-sac. It was necessary to proceed with endovaginal exam following the transabdominal exam to visualize the endometrium and ovaries. COMPARISON:  None FINDINGS: Uterus Measurements: 8 x 3.2 x 4.2 cm. No fibroids or other mass visualized. Endometrium Thickness: 6 mm.  No focal abnormality visualized. Right ovary Measurements: 3.8 x 1.2 x 1.8 cm. Normal  appearance/no adnexal mass. Left ovary Measurements: 2.7 x 2.0 x 2.4 cm. Normal appearance/no adnexal mass. Other findings Trace fluid in the cul-de-sac, likely physiologic. IMPRESSION: No acute abnormalities to explain the patient's symptoms. Electronically Signed   By: Gerome Samavid  Williams III M.D   On: 10/27/2015 17:00     MDM UPT negative CBC -- hemoglobin 14 Orthostatic VS -- wnl Pt declined toradol Phenergan 12.5 mg PO & ibuprofen 600 mg PO S/w Dr. Richardson Doppole regarding patient's presentation, assessment, VS, & labs -- will order ultrasound in MAU Pain from 10>7 s/p meds Reviewed ultrasound report with Dr. Richardson Doppole -- discharge home w/rx ibuprofen, pt to f/u in office  Assessment and Plan  :A 1. Dysmenorrhea   2. Abdominal pain in female    P: Discharge home Rx ibuprofen & phenergan Alternate ibuprofen & tylenol as needed Call office to schedule follow up appointment  Judeth HornErin Aahana Elza 10/27/2015, 2:52 PM

## 2016-06-07 ENCOUNTER — Emergency Department (HOSPITAL_COMMUNITY)
Admission: EM | Admit: 2016-06-07 | Discharge: 2016-06-07 | Disposition: A | Discharge status: Home / Self Care | Payer: Medicaid Other | Attending: Emergency Medicine | Admitting: Emergency Medicine

## 2016-06-07 ENCOUNTER — Emergency Department (HOSPITAL_COMMUNITY): Payer: Medicaid Other

## 2016-06-07 ENCOUNTER — Encounter (HOSPITAL_COMMUNITY): Payer: Self-pay | Admitting: Emergency Medicine

## 2016-06-07 DIAGNOSIS — J45901 Unspecified asthma with (acute) exacerbation: Secondary | ICD-10-CM | POA: Insufficient documentation

## 2016-06-07 DIAGNOSIS — J069 Acute upper respiratory infection, unspecified: Secondary | ICD-10-CM | POA: Insufficient documentation

## 2016-06-07 DIAGNOSIS — Z79899 Other long term (current) drug therapy: Secondary | ICD-10-CM | POA: Insufficient documentation

## 2016-06-07 MED ORDER — PREDNISONE 20 MG PO TABS
60.0000 mg | ORAL_TABLET | Freq: Once | ORAL | Status: AC
  Administered 2016-06-07: 60 mg via ORAL
  Filled 2016-06-07: qty 3

## 2016-06-07 MED ORDER — ALBUTEROL SULFATE (2.5 MG/3ML) 0.083% IN NEBU
5.0000 mg | INHALATION_SOLUTION | Freq: Once | RESPIRATORY_TRACT | Status: AC
  Administered 2016-06-07: 5 mg via RESPIRATORY_TRACT

## 2016-06-07 MED ORDER — ALBUTEROL SULFATE (2.5 MG/3ML) 0.083% IN NEBU: 2.5000 mg | INHALATION_SOLUTION | Freq: Four times a day (QID) | RESPIRATORY_TRACT | 12 refills | Status: AC | PRN

## 2016-06-07 MED ORDER — AZITHROMYCIN 250 MG PO TABS
500.0000 mg | ORAL_TABLET | Freq: Once | ORAL | Status: AC
  Administered 2016-06-07: 500 mg via ORAL
  Filled 2016-06-07: qty 2

## 2016-06-07 MED ORDER — PREDNISONE 10 MG PO TABS: 40.0000 mg | ORAL_TABLET | Freq: Every day | ORAL | 0 refills | Status: AC

## 2016-06-07 MED ORDER — ALBUTEROL SULFATE HFA 108 (90 BASE) MCG/ACT IN AERS: 2.0000 | INHALATION_SPRAY | RESPIRATORY_TRACT | 1 refills | Status: AC | PRN

## 2016-06-07 MED ORDER — BENZONATATE 100 MG PO CAPS: 100.0000 mg | ORAL_CAPSULE | Freq: Three times a day (TID) | ORAL | 0 refills | Status: AC

## 2016-06-07 MED ORDER — IPRATROPIUM-ALBUTEROL 0.5-2.5 (3) MG/3ML IN SOLN
3.0000 mL | Freq: Once | RESPIRATORY_TRACT | Status: AC
  Administered 2016-06-07: 3 mL via RESPIRATORY_TRACT
  Filled 2016-06-07: qty 3

## 2016-06-07 MED ORDER — ALBUTEROL SULFATE (2.5 MG/3ML) 0.083% IN NEBU
INHALATION_SOLUTION | RESPIRATORY_TRACT | Status: AC
  Administered 2016-06-07: 5 mg via RESPIRATORY_TRACT
  Filled 2016-06-07: qty 6

## 2016-06-07 MED ORDER — AZITHROMYCIN 250 MG PO TABS: 250.0000 mg | ORAL_TABLET | Freq: Every day | ORAL | 0 refills | Status: AC

## 2016-06-07 MED ORDER — ALBUTEROL SULFATE HFA 108 (90 BASE) MCG/ACT IN AERS
2.0000 | INHALATION_SPRAY | Freq: Once | RESPIRATORY_TRACT | Status: AC
Start: 2016-06-07 — End: 2016-06-07
  Administered 2016-06-07: 2 via RESPIRATORY_TRACT
  Filled 2016-06-07: qty 6.7

## 2016-06-07 NOTE — ED Provider Notes (Signed)
[facility]-EMERGENCY DEPT Provider Note   CSN: [id number] Arrival date & time: 06/07/16  4098     History   Chief Complaint Chief Complaint  Patient presents with  . Asthma    HPI Michele Peters is a 21 y.o. female.  HPI    Michele Peters is a 21 y.o. female, with a history of asthma, presenting to the ED with nonproductive cough, shortness of breath, fever, and body aches for the past several days. Patient has tried using her home nebulizer with reduction in shortness of breath. She has not taken any other medications for her symptoms. Denies chest pain, abdominal pain, N/V/D, or any other complaints.      Past Medical History:  Diagnosis Date  . Asthma   . PCOS (polycystic ovarian syndrome)   . Pneumonia     There are no active problems to display for this patient.   Past Surgical History:  Procedure Laterality Date  . DILATION AND CURETTAGE OF UTERUS  2015   SAB  . WISDOM TOOTH EXTRACTION      OB History    Gravida Para Term Preterm AB Living   1       1     SAB TAB Ectopic Multiple Live Births   1               Home Medications    Prior to Admission medications   Medication Sig Start Date End Date Taking? Authorizing Provider  albuterol (PROVENTIL HFA;VENTOLIN HFA) 108 (90 Base) MCG/ACT inhaler Inhale 2 puffs into the lungs every 4 (four) hours as needed for wheezing or shortness of breath. 06/07/16   Joy, Shawn C, PA-C  albuterol (PROVENTIL) (2.5 MG/3ML) 0.083% nebulizer solution Take 3 mLs (2.5 mg total) by nebulization every 6 (six) hours as needed for wheezing or shortness of breath. 06/07/16   Joy, Shawn C, PA-C  azithromycin (ZITHROMAX Z-PAK) 250 MG tablet Take 1 tablet (250 mg total) by mouth daily. 06/08/16 06/12/16  Joy, Shawn C, PA-C  benzonatate (TESSALON) 100 MG capsule Take 1 capsule (100 mg total) by mouth every 8 (eight) hours. 06/07/16   Joy, Shawn C, PA-C  ibuprofen (ADVIL,MOTRIN) 800 MG tablet Take 1 tablet (800 mg total) by mouth 3 (three)  times daily with meals as needed for headache or moderate pain. 10/27/15   Judeth Horn, NP  OVER THE COUNTER MEDICATION Take 1 packet by mouth as needed (pain). Patient takes [employer] as needed for pain    [provider]  predniSONE (DELTASONE) 10 MG tablet Take 4 tablets (40 mg total) by mouth daily with breakfast. 06/07/16 06/12/16  Joy, Ines Bloomer C, PA-C  Prenatal Vit-Fe Fumarate-FA (PRENATAL MULTIVITAMIN) TABS tablet Take 1 tablet by mouth daily at 12 noon.    [provider]  promethazine (PHENERGAN) 12.5 MG tablet Take 1 tablet (12.5 mg total) by mouth every 6 (six) hours as needed for nausea or vomiting. 10/27/15   Judeth Horn, NP    Family History No family history on file.  Social History Social History  Substance Use Topics  . Smoking status: Never Smoker  . Smokeless tobacco: Never Used  . Alcohol use No     Allergies   Eggs or egg-derived products; Other; Tetanus toxoids; and Zofran [ondansetron hcl]   Review of Systems Review of Systems  Constitutional: Positive for fever. Negative for diaphoresis.  Respiratory: Positive for cough and shortness of breath.   Cardiovascular: Negative for chest pain.  Gastrointestinal: Negative for diarrhea, nausea and  vomiting.  All other systems reviewed and are negative.    Physical Exam Updated Vital Signs BP 117/75   Pulse 88   Temp 97.6 F (36.4 C) (Axillary)   Resp 20   Ht 4\' 11"  (1.499 m)   LMP 06/03/2016   SpO2 98%   Physical Exam  Constitutional: She appears well-developed and well-nourished. No distress.  HENT:  Head: Normocephalic and atraumatic.  Eyes: Conjunctivae are normal.  Neck: Neck supple.  Cardiovascular: Normal rate, regular rhythm, normal heart sounds and intact distal pulses.   Pulmonary/Chest: She has wheezes (global, faint, expiratory).  Able to speak in full sentences with intermittent coughing. Good air movement.  Abdominal: Soft. There is no tenderness. There is no  guarding.  Musculoskeletal: She exhibits no edema.  Lymphadenopathy:    She has no cervical adenopathy.  Neurological: She is alert.  Skin: Skin is warm and dry. She is not diaphoretic.  Psychiatric: She has a normal mood and affect. Her behavior is normal.  Nursing note and vitals reviewed.    ED Treatments / Results  Labs (all labs ordered are listed, but only abnormal results are displayed) Labs Reviewed - No data to display  EKG  EKG Interpretation None       Radiology Dg Chest 2 View  Result Date: 06/07/2016 CLINICAL DATA:  Asthma flare up since Monday. EXAM: CHEST  2 VIEW COMPARISON:  05/23/2015 FINDINGS: The heart size and mediastinal contours are within normal limits. Both lungs are clear. The visualized skeletal structures are unremarkable. IMPRESSION: No active cardiopulmonary disease. Electronically Signed   By: Burman Nieves M.D.   On: 06/07/2016 04:18    Procedures Procedures (including critical care time)  Medications Ordered in ED Medications  albuterol (PROVENTIL) (2.5 MG/3ML) 0.083% nebulizer solution 5 mg (5 mg Nebulization Given 06/07/16 0353)  ipratropium-albuterol (DUONEB) 0.5-2.5 (3) MG/3ML nebulizer solution 3 mL (3 mLs Nebulization Given 06/07/16 0552)  predniSONE (DELTASONE) tablet 60 mg (60 mg Oral Given 06/07/16 0552)  albuterol (PROVENTIL HFA;VENTOLIN HFA) 108 (90 Base) MCG/ACT inhaler 2 puff (2 puffs Inhalation Given 06/07/16 0552)  azithromycin (ZITHROMAX) tablet 500 mg (500 mg Oral Given 06/07/16 1610)     Initial Impression / Assessment and Plan / ED Course  I have reviewed the triage vital signs and the nursing notes.  Pertinent labs & imaging results that were available during my care of the patient were reviewed by me and considered in my medical decision making (see chart for details).  Clinical Course as of Jun 08 723  Fri Jun 07, 2016  9604 Patient voices significant improvement with treatments here in the ED. States she is ready  for discharge.  [SJ]    Clinical Course User Index [SJ] Joy, Shawn C, PA-C     Patient presents with asthma exacerbation. We have been precipitated by viral illness, however, due to patient history and presenting symptoms bacterial coverage is warranted. PCP follow-up. Resources given. The patient was given instructions for home care as well as return precautions. Patient voices understanding of these instructions, accepts the plan, and is comfortable with discharge.   Vitals:   06/07/16 0530 06/07/16 0545 06/07/16 0600 06/07/16 0701  BP: 121/69 112/79 (!) 113/94 120/83  Pulse: (!) 106 93 (!) 105 76  Resp:    18  Temp:    98.8 F (37.1 C)  TempSrc:    Oral  SpO2: 98% 97% 99% 99%  Height:         Final Clinical Impressions(s) / ED  Diagnoses   Final diagnoses:  Upper respiratory tract infection, unspecified type  Exacerbation of asthma, unspecified asthma severity, unspecified whether persistent    New Prescriptions Discharge Medication List as of 06/07/2016  6:58 AM    START taking these medications   Details  albuterol (PROVENTIL HFA;VENTOLIN HFA) 108 (90 Base) MCG/ACT inhaler Inhale 2 puffs into the lungs every 4 (four) hours as needed for wheezing or shortness of breath., Starting Fri 06/07/2016, Print    albuterol (PROVENTIL) (2.5 MG/3ML) 0.083% nebulizer solution Take 3 mLs (2.5 mg total) by nebulization every 6 (six) hours as needed for wheezing or shortness of breath., Starting Fri 06/07/2016, Print    azithromycin (ZITHROMAX Z-PAK) 250 MG tablet Take 1 tablet (250 mg total) by mouth daily., Starting Sat 06/08/2016, Until Wed 06/12/2016, Print    benzonatate (TESSALON) 100 MG capsule Take 1 capsule (100 mg total) by mouth every 8 (eight) hours., Starting Fri 06/07/2016, Print    predniSONE (DELTASONE) 10 MG tablet Take 4 tablets (40 mg total) by mouth daily with breakfast., Starting Fri 06/07/2016, Until Wed 06/12/2016, Print         Joy, [place] CreekShawn C, PA-C 06/07/16 0727     Ward, Layla MawKristen N, DO 06/07/16 [phone]

## 2016-06-07 NOTE — Discharge Instructions (Signed)
Your symptoms are consistent with a viral illness causing an exacerbation of your asthma. Treatment is largely symptomatic care and it is important to note that these symptoms may last for 7-14 days.   Hand washing: Wash your hands throughout the day, but especially before and after touching the face, using the restroom, sneezing, coughing, or touching surfaces that have been coughed or sneezed upon. Hydration: Symptoms will be intensified and complicated by dehydration. Dehydration can also extend the duration of symptoms. Drink plenty of fluids and get plenty of rest. You should be drinking at least half a liter of water an hour to stay hydrated. Electrolyte drinks are also encouraged. You should be drinking enough fluids to make your urine light yellow, almost clear. If this is not the case, you are not drinking enough water. Please note that some of the treatments indicated below will not be effective if you are not adequately hydrated. Pain or fever: Ibuprofen, Naproxen, or Tylenol for pain or fever.  Cough: Use the Tessalon for cough.  Congestion: Plain Mucinex may help relieve congestion. Saline sinus rinses and saline nasal sprays may also help relieve congestion.  Sore throat: Warm liquids or Chloraseptic spray may help soothe a sore throat. Gargle twice a day with a salt water solution made from a half teaspoon of salt in a cup of warm water.  Follow up: Follow up with a primary care provider as soon as possible. Return: Return to the ED should symptoms worsen.

## 2016-06-07 NOTE — ED Triage Notes (Signed)
Pt c/o asthma flare up ongoing since Monday. Pt took a breathing treatment at 9 pm. Pt unable to talk in complete sentences without difficulty.
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
